# Patient Record
Sex: Male | Born: 1955 | Hispanic: No | Smoking: Former smoker
Health system: Southern US, Community
[De-identification: ages and names within clinical notes are randomized; demographics above are authoritative.]

## PROBLEM LIST (undated history)

## (undated) DIAGNOSIS — F419 Anxiety disorder, unspecified: Secondary | ICD-10-CM

## (undated) DIAGNOSIS — N184 Chronic kidney disease, stage 4 (severe): Secondary | ICD-10-CM

## (undated) DIAGNOSIS — B182 Chronic viral hepatitis C: Secondary | ICD-10-CM

## (undated) DIAGNOSIS — R351 Nocturia: Secondary | ICD-10-CM

## (undated) DIAGNOSIS — N401 Enlarged prostate with lower urinary tract symptoms: Secondary | ICD-10-CM

## (undated) HISTORY — DX: Chronic viral hepatitis C: B18.2

## (undated) HISTORY — DX: Nocturia: R35.1

## (undated) HISTORY — DX: Anxiety disorder, unspecified: F41.9

## (undated) HISTORY — DX: Chronic kidney disease, stage 4 (severe): N18.4

## (undated) HISTORY — DX: Benign prostatic hyperplasia with lower urinary tract symptoms: N40.1

---

## 2002-08-20 ENCOUNTER — Encounter: Payer: Self-pay | Admitting: Neurosurgery

## 2002-08-20 ENCOUNTER — Ambulatory Visit (HOSPITAL_COMMUNITY): Admission: RE | Admit: 2002-08-20 | Discharge: 2002-08-21 | Payer: Self-pay | Admitting: Neurosurgery

## 2002-12-21 ENCOUNTER — Inpatient Hospital Stay (HOSPITAL_COMMUNITY): Admission: RE | Admit: 2002-12-21 | Discharge: 2002-12-24 | Payer: Self-pay | Admitting: Neurosurgery

## 2012-10-03 DIAGNOSIS — M47812 Spondylosis without myelopathy or radiculopathy, cervical region: Secondary | ICD-10-CM | POA: Insufficient documentation

## 2012-11-06 DIAGNOSIS — I10 Essential (primary) hypertension: Secondary | ICD-10-CM | POA: Insufficient documentation

## 2012-11-06 DIAGNOSIS — N028 Recurrent and persistent hematuria with other morphologic changes: Secondary | ICD-10-CM | POA: Insufficient documentation

## 2015-02-09 DIAGNOSIS — E785 Hyperlipidemia, unspecified: Secondary | ICD-10-CM | POA: Insufficient documentation

## 2015-02-09 DIAGNOSIS — F3341 Major depressive disorder, recurrent, in partial remission: Secondary | ICD-10-CM | POA: Insufficient documentation

## 2015-02-09 DIAGNOSIS — A6000 Herpesviral infection of urogenital system, unspecified: Secondary | ICD-10-CM | POA: Insufficient documentation

## 2015-02-09 DIAGNOSIS — N401 Enlarged prostate with lower urinary tract symptoms: Secondary | ICD-10-CM

## 2015-02-09 DIAGNOSIS — N529 Male erectile dysfunction, unspecified: Secondary | ICD-10-CM | POA: Insufficient documentation

## 2015-02-09 DIAGNOSIS — F419 Anxiety disorder, unspecified: Secondary | ICD-10-CM

## 2015-02-09 DIAGNOSIS — F988 Other specified behavioral and emotional disorders with onset usually occurring in childhood and adolescence: Secondary | ICD-10-CM | POA: Insufficient documentation

## 2015-02-09 DIAGNOSIS — N184 Chronic kidney disease, stage 4 (severe): Secondary | ICD-10-CM

## 2015-02-09 DIAGNOSIS — K219 Gastro-esophageal reflux disease without esophagitis: Secondary | ICD-10-CM | POA: Insufficient documentation

## 2015-02-09 DIAGNOSIS — R351 Nocturia: Secondary | ICD-10-CM

## 2015-02-09 DIAGNOSIS — B182 Chronic viral hepatitis C: Secondary | ICD-10-CM

## 2015-02-09 HISTORY — DX: Chronic kidney disease, stage 4 (severe): N18.4

## 2015-02-09 HISTORY — DX: Anxiety disorder, unspecified: F41.9

## 2015-02-09 HISTORY — DX: Benign prostatic hyperplasia with lower urinary tract symptoms: N40.1

## 2015-02-09 HISTORY — DX: Chronic viral hepatitis C: B18.2

## 2015-07-01 DIAGNOSIS — M1A39X0 Chronic gout due to renal impairment, multiple sites, without tophus (tophi): Secondary | ICD-10-CM | POA: Insufficient documentation

## 2017-01-09 DIAGNOSIS — J301 Allergic rhinitis due to pollen: Secondary | ICD-10-CM | POA: Insufficient documentation

## 2018-02-19 NOTE — Progress Notes (Signed)
Patient Name: Tony Sandoval  Date of Birth: [birth year]  MRN: [id number]  PCP: Houston Siren., MD  Referring Provider: Dr. Blenda Mounts   Patient Active Problem List   Diagnosis Date Noted  . Seasonal allergic rhinitis due to pollen 01/09/2017  . 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 ADD (attention deficit disorder) 02/09/2015  . Anxiety disorder 02/09/2015  . Benign prostatic hyperplasia with nocturia 02/09/2015  . Chronic kidney disease, stage IV (severe) ([place]) 02/09/2015  . Chronic viral hepatitis C ([place]) 02/09/2015  . ED (erectile dysfunction) 02/09/2015  . Esophageal reflux 02/09/2015  . Genital herpes 02/09/2015  . Hyperlipidemia 02/09/2015  . Recurrent major depressive disorder, in partial remission ([place]) 02/09/2015  . HTN (hypertension), benign 11/06/2012  . IgA nephropathy 11/06/2012  . Cervical spondylosis 10/03/2012   CC: New patient here for hepatitis c treatment / evaluation.   HPI/ROS:  Tony Sandoval is a 63 y.o. male who presents for initial evaluation and management of chronic hepatitis C.  He tells me that he was previously told about his hepatitis C labs > 10 years ago with blood donation. He has never had further work up done for this and has never had any treatment for this. He has a history of significant back surgery in 1988 where he may have required a blood transfusion. He feels that he may have alternatively acquired through sexual encounter with male in the past. He denies any IVDU or intranasal drug use. Not on HD but CKD stage IV and trajectory looks like he may require in the future. He drinks "six days out of seven" and ranges from 3-8 beers daily. He has been doing this for 10 years now. He would like to quit/reduce use and feels this will be a good opportunity to allow that.   Patient does not have documented immunity to Hepatitis A. Patient does not have documented immunity to Hepatitis B.    Constitutional:  negative for fevers, chills, malaise and anorexia Eyes: negative for icterus Respiratory: negative for cough, sputum or dyspnea on exertion Cardiovascular: negative for chest pain, orthopnea, lower extremity edema Gastrointestinal: negative for melena, abdominal pain and jaundice Integument/breast: negative for rash and pruritus Hematologic/lymphatic: negative for bleeding and petechiae Neurological: negative for coordination problems, tremor and weakness All other systems reviewed and are negative      Past Medical History:  Diagnosis Date  . Anxiety disorder 02/09/2015  . Benign prostatic hyperplasia with nocturia 02/09/2015  . Chronic kidney disease, stage IV (severe) ([place]) 02/09/2015  . Chronic kidney disease, stage IV (severe) ([place]) 02/09/2015  . Chronic viral hepatitis C ([place]) 02/09/2015    Prior to Admission medications   Not on File    Allergies  Allergen Reactions  . Celecoxib Other (See Comments)    Affects emptying the bladder Affects emptying the bladder   . Nsaids Other (See Comments)  . Tramadol Other (See Comments)    Issues with emptying bladder Issues with emptying bladder     Social History   Tobacco Use  . Smoking status: Former Research scientist (life sciences)  . Smokeless tobacco: Never Used  Substance Use Topics  . Alcohol use: Yes    Alcohol/week: 40.0 standard drinks    Types: 40 Cans of beer per week  . Drug use: Never    Family History  Problem Relation Age of Onset  . Liver cancer Neg Hx   . Liver disease Neg Hx  Objective:   Vitals:   02/20/18 1005  BP: 121/85  Pulse: 79  Temp: (!) 97.3 F (36.3 C)   Constitutional: in no apparent distress, well developed and well nourished and oriented times 3 Eyes: anicteric Cardiovascular: Cor RRR and No murmurs Respiratory: clear Gastrointestinal: Bowel sounds are normal, liver is not enlarged, spleen is not enlarged Musculoskeletal: peripheral pulses normal, no pedal edema, no clubbing or cyanosis Skin: negative  for - jaundice, spider hemangioma, telangiectasia, palmar erythema, ecchymosis and atrophy, negatives Lymphatic: no cervical lymphadenopathy   Laboratory: Genotype: No results found for: HCVGENOTYPE HCV viral load: No results found for: HCVQUANT No results found for: WBC, HGB, HCT, MCV, PLT No results found for: CREATININE, BUN, NA, K, CL, CO2 No results found for: ALT, AST, GGT, ALKPHOS  No results found for: INR, BILITOT, ALBUMIN   Imaging:  Assessment & Plan:   Problem List Items Addressed This Visit      Unprioritized   Chronic kidney disease, stage IV (severe) ([place])    Plan for [place] with eGFR < 30 mL/min       Chronic viral hepatitis C ([place]) - Primary    New Patient with Chronic Hepatitis C genotype unknown, treatment naive.   I discussed with the patient the lab findings that confirm chronic hepatitis C as well as the natural history and progression of disease including about 30% of people who develop cirrhosis of the liver if left untreated and once cirrhosis is established there is a 2-7% risk per year of liver cancer and liver failure.  I discussed the importance of treatment and benefits in reducing the risk, even if significant liver fibrosis exists. I also discussed risk for re-infection following treatment should he not continue to modify risk factors.    Patient counseled extensively on limiting acetaminophen to no more than 2 grams daily, avoidance of alcohol.  Transmission discussed with patient including sexual transmission, sharing razors and toothbrush.   Will need referral to gastroenterology if concern for cirrhosis  Will prescribe appropriate medication based on genotype and coverage   Hepatitis A and B titers to be drawn today with appropriate vaccinations as needed   Pneumovax vaccine at upcoming visit if not previously given  Further work up to include liver staging through non-invasive serum analysis and elastography.   Will call Rae Halsted  back once all results are in and counsel on medication over the phone. He will return 4 weeks after starting to meet with pharmacy team and check RNA at that time.         Relevant Medications   acyclovir (ZOVIRAX) 200 MG capsule   mycophenolate (CELLCEPT) 500 MG tablet   valACYclovir (VALTREX) 1000 MG tablet   Other Relevant Orders   HIV Antibody (routine testing w rflx)   Hepatitis A Ab, Total   Hepatitis B surface antibody,qualitative   Hepatitis B surface antigen   Hepatitis B Core Antibody, total   Hepatitis C RNA quantitative   Hepatitis C genotype   Protime-INR   Liver Fibrosis, FibroTest-ActiTest   US ABDOMEN COMPLETE W/ELASTOGRAPHY     I spent 45 minutes with the patient including greater than 70% of time in face to face counsel of the patient re hepatitis c and the details described above and in coordination of their care.  Janene Madeira, MSN, NP-C [facility] for [email or web address] Disease [email or web address] Pager: [phone] Office: [phone]

## 2018-02-20 ENCOUNTER — Ambulatory Visit (INDEPENDENT_AMBULATORY_CARE_PROVIDER_SITE_OTHER): Payer: BLUE CROSS/BLUE SHIELD | Admitting: Infectious Diseases

## 2018-02-20 ENCOUNTER — Encounter: Payer: Self-pay | Admitting: Infectious Diseases

## 2018-02-20 VITALS — BP 121/85 | HR 79 | Temp 97.3°F | Wt 252.0 lb

## 2018-02-20 DIAGNOSIS — B182 Chronic viral hepatitis C: Secondary | ICD-10-CM

## 2018-02-20 DIAGNOSIS — N184 Chronic kidney disease, stage 4 (severe): Secondary | ICD-10-CM | POA: Diagnosis not present

## 2018-02-20 NOTE — Assessment & Plan Note (Signed)
Plan for [place] with eGFR < 30 mL/min

## 2018-02-20 NOTE — Assessment & Plan Note (Addendum)
New Patient with Chronic Hepatitis C genotype unknown, treatment naive.   I discussed with the patient the lab findings that confirm chronic hepatitis C as well as the natural history and progression of disease including about 30% of people who develop cirrhosis of the liver if left untreated and once cirrhosis is established there is a 2-7% risk per year of liver cancer and liver failure.  I discussed the importance of treatment and benefits in reducing the risk, even if significant liver fibrosis exists. I also discussed risk for re-infection following treatment should he not continue to modify risk factors.    Patient counseled extensively on limiting acetaminophen to no more than 2 grams daily, avoidance of alcohol.  Transmission discussed with patient including sexual transmission, sharing razors and toothbrush.   Will need referral to gastroenterology if concern for cirrhosis  Will prescribe appropriate medication based on genotype and coverage   Hepatitis A and B titers to be drawn today with appropriate vaccinations as needed   Pneumovax vaccine at upcoming visit if not previously given  Further work up to include liver staging through non-invasive serum analysis and elastography.   Will call Rae Halsted back once all results are in and counsel on medication over the phone. He will return 4 weeks after starting to meet with pharmacy team and check RNA at that time.

## 2018-02-20 NOTE — Progress Notes (Signed)
HPI: Tony Sandoval is a 63 y.o. male who presents to the [place] clinic to initiate care with Colletta Maryland for his chronic Hepatitis C infection.   Patient Active Problem List   Diagnosis Date Noted  . Seasonal allergic rhinitis due to pollen 01/09/2017  . 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 ADD (attention deficit disorder) 02/09/2015  . Anxiety disorder 02/09/2015  . Benign prostatic hyperplasia with nocturia 02/09/2015  . Chronic kidney disease, stage IV (severe) ([place]) 02/09/2015  . Chronic viral hepatitis C ([place]) 02/09/2015  . ED (erectile dysfunction) 02/09/2015  . Esophageal reflux 02/09/2015  . Genital herpes 02/09/2015  . Hyperlipidemia 02/09/2015  . Recurrent major depressive disorder, in partial remission ([place]) 02/09/2015  . HTN (hypertension), benign 11/06/2012  . IgA nephropathy 11/06/2012  . Cervical spondylosis 10/03/2012    Patient's Medications  New Prescriptions   No medications on file  Previous Medications   ACYCLOVIR (ZOVIRAX) 200 MG CAPSULE    Take by mouth.   ALLOPURINOL (ZYLOPRIM) 300 MG TABLET    Take by mouth.   ALPRAZOLAM (XANAX) 1 MG TABLET    Take by mouth.   AMITRIPTYLINE (ELAVIL) 50 MG TABLET       BENAZEPRIL (LOTENSIN) 20 MG TABLET       BUSPIRONE (BUSPAR) 15 MG TABLET    TAKE 1 TABLET BY MOUTH TWICE DAILY   ESCITALOPRAM (LEXAPRO) 20 MG TABLET    TAKE 1 TABLET BY MOUTH EVERY DAY   GABAPENTIN (NEURONTIN) 300 MG CAPSULE    Take by mouth.   HYDROCORTISONE (ANUSOL-HC) 2.5 % RECTAL CREAM    Place rectally.   METHYLPHENIDATE (RITALIN) 20 MG TABLET    Take by mouth.   MYCOPHENOLATE (CELLCEPT) 500 MG TABLET    Take by mouth.   NORTRIPTYLINE (PAMELOR) 50 MG CAPSULE    TAKE 1-3 CAPSULES AT BEDTIME   PANTOPRAZOLE (PROTONIX) 40 MG TABLET    Take by mouth.   POLYETHYLENE GLYCOL 3350 (PEG 3350) POWD    Take by mouth.   VALACYCLOVIR (VALTREX) 1000 MG TABLET      Modified Medications   No medications on file  Discontinued  Medications   No medications on file    Allergies: Allergies  Allergen Reactions  . Celecoxib Other (See Comments)    Affects emptying the bladder Affects emptying the bladder   . Nsaids Other (See Comments)  . Tramadol Other (See Comments)    Issues with emptying bladder Issues with emptying bladder     Past Medical History: No past medical history on file.  Social History: Social History   Socioeconomic History  . Marital status: Divorced    Spouse name: Not on file  . Number of children: Not on file  . Years of education: Not on file  . Highest education level: Not on file  Occupational History  . Not on file  Social Needs  . Financial resource strain: Not on file  . Food insecurity:    Worry: Not on file    Inability: Not on file  . Transportation needs:    Medical: Not on file    Non-medical: Not on file  Tobacco Use  . Smoking status: Former Research scientist (life sciences)  . Smokeless tobacco: Never Used  Substance and Sexual Activity  . Alcohol use: Not on file  . Drug use: Not on file  . Sexual activity: Not on file  Lifestyle  . Physical activity:    Days per week: Not on file  Minutes per session: Not on file  . Stress: Not on file  Relationships  . Social connections:    Talks on phone: Not on file    Gets together: Not on file    Attends religious service: Not on file    Active member of club or organization: Not on file    Attends meetings of clubs or organizations: Not on file    Relationship status: Not on file  Other Topics Concern  . Not on file  Social History Narrative  . Not on file    Labs: Hepatitis C No results found for: HCVGENOTYPE, HEPCAB, HCVRNAPCRQN, FIBROSTAGE Hepatitis B No results found for: HEPBSAB, HEPBSAG, HEPBCAB Hepatitis A No results found for: HAV HIV No results found for: HIV No results found for: CREATININE No results found for: AST, ALT, INR  Assessment: Tony Sandoval is here today to initiate care for his Hep C infection.  We  don't have any baseline labs for him today so Colletta Maryland will order those.  Met with the patient and discussed our process for medication and management.  Will call and coordinate starting once all of his labs have returned.    Plan: - Hep C labs   L. , PharmD, BCIDP, AAHIVP, [place] for Infectious Disease 02/20/2018, 11:04 AM

## 2018-02-20 NOTE — Patient Instructions (Signed)
Nice to meet you today!    We need to get a little more information about your hepatitis c infection before we start your treatment. I anticipate that we can get you started in a few weeks after we submit approval to your insurance to ensure payment.   Would like to go ahead and set up a liver ultrasound for you to make certain we fully understand your liver and assess for any existing damage.    ABOUT HEPATITIS C VIRUS:   Chronic Hepatitis C is the leading cause of cirrhosis, liver cancer, and end stage liver disease requiring transplantation when this infection goes untreated for many years  There are not many specific symptoms of early infection and often goes unrecognized until specific blood test is ordered  The hepatitis c virus is passed primarily through direct exposure of contaminated blood or body fluids -   Risk for sexual transmission is very low but is possible if there is high frequency of unprotected sexual activity with known hepatitis c partner or multiple partners of known status.  Advanced cirrhosis occurs in approximately 10-20% of those with chronic infection.  Approximately 15-25% clear spontaneously (usually in the first 6 months of becoming exposed to virus)  Newer medications provide over 90% cure rate when taken as prescribed  IN GENERAL ABOUT DIET  . Persons living with chronic hepatitis c infection should have a balanced diet and choose nutritious foods from each major food group to maintain a healthy weight and avoid nutritional deficiencies.  .  . Patients with cirrhosis should not have protein restriction; we recommend a protein intake of approximately 1.2-1.5 g/kg/day.  . For patients with cirrhosis and hepatic encephalopathy, the American Association for the Study of Liver Diseases (AASLD) recommended protein intake is 1.2-1.5 g/kg/day.[82] . If you experience ascites please limit sodium intake to < 2000 mg a day   UNTIL YOU HAVE BEEN TREATED AND  CURED:  . Use condoms with sexual encounters or abstinence . No sharing of razors, toothbrushes, nail clippers or anything that could potentially have blood on it.  . If you cut yourself and blood spills onto item/surface please clean with 1:10 bleach solution and allow to dry, EVEN if it is dried blood.  . Limit alcohol to as little as possible to less than 1 drink a day - this is very irritating to your liver. . Limit tylenol use to less than 2,000 mg daily (two extra strength tablets only twice a day) . If you have cirrhosis of the liver please take no more than 1,000 mg tylenol a day   GENERAL HELPFUL HINTS ON HCV THERAPY: 1. Stay well-hydrated. 2. Notify the ID Clinic of any changes in your other over-the-counter/herbal or prescription medications. 3. If you miss a dose of your medication, take the missed dose as soon as you remember. Return to your regular time/dose schedule the next day.  4.  Do not stop taking your medications without first talking with your healthcare provider. 5.  You will see our pharmacist-specialist within the first 2 weeks of starting your medication to monitor for any possible side effects. 6.  You will have blood work once during treatment 4 weeks after your first pill. Again soon after treatment is completed and one final lab 3 months after your last pill to ensure cure!   TIPS TO BE SUCCESSFUL WITH DAILY MEDICATION USE: 1. Set a reminder on your phone  2. Try filling out a pill box for the week - pick  a day and put one pill for every day during the week so you know right away if you missed a pill.  3. Have a trusted family member ask you about your medications.  4. Smartphone app    Medication we would like to use for you will be : MAVYRET  Mavyret Instructions:  1. Take Mavyret, three tablets (at the same time) daily with food. Please take ALL THREE PILLS AT ONCE. You should take it at approximately the same time every day. Treatment will be for 8  weeks. Do not miss a dose.    2. Do not run out of [place]! If you are down to one week of medication left and have not heard about your next shipment, please let us know as soon as possible. You will be given 28 days of treatment at a time and will receive one refill.   3. If you need to start a new medication, prescription from your doctor or over the counter medication, you need to contact us to make sure it does not interfere with [place]. There are several medications that can interfere with Mavyret and can make you sick or make the medication not work.  4. If you need to take a medication for acid reflux, you can take omeprazole 20mg  daily.     5. Tylenol (acetominophen) and Advil (ibuprofen) are safe to take with [place] if needed for headache, fever, pain.   6. IF YOU ARE ON BIRTH CONTROL PILLS, YOU RECEIVE A SHOT FOR BIRTH CONTROL, OR YOU HAVE AN IUD, please notify your provider to make sure it is safe with Mavyret.   7. DO NOT stop Mavyret unless instructed to by your provider. If you are hospitalized while taking this medication please bring it with you to the hospital to avoid interruption of therapy. Every pill is important!  8. The most common side effects associated with Mavyret include:  o Fatigue o Headache o Nausea o Diarrhea o Insomnia

## 2018-02-21 NOTE — Progress Notes (Signed)
Immunity through cleared hep b infection. Will need Hep A vaccines.

## 2018-02-25 ENCOUNTER — Telehealth: Payer: Self-pay | Admitting: Infectious Diseases

## 2018-02-25 NOTE — Telephone Encounter (Signed)
Attempted to call patient with results of Hep C lab work however the number on file is not working and listed work number has no answer.   His hep c RNA resulted as undetectable. He does not need any treatment as it appears he previously cleared his infection naturally vs had a false positive antibody test in the first place. He has evidence of previously cleared hep b infection as well.   No further intervention needed with ID team. Would consider checking regular liver ultrasound to evaluate considering his heavy alcohol history and intermittent transaminitis however will defer to his PCP.   Janene Madeira, MSN, NP-C [facility] for Infectious Disease [email or web address] Pager: [phone] Office: [phone]

## 2018-02-27 ENCOUNTER — Ambulatory Visit (HOSPITAL_COMMUNITY)
Admission: RE | Admit: 2018-02-27 | Discharge: 2018-02-27 | Disposition: A | Discharge status: Home / Self Care | Payer: BLUE CROSS/BLUE SHIELD | Source: Ambulatory Visit | Attending: Infectious Diseases | Admitting: Infectious Diseases

## 2018-02-27 DIAGNOSIS — B182 Chronic viral hepatitis C: Secondary | ICD-10-CM

## 2018-02-28 LAB — PROTIME-INR
INR: 1
Prothrombin Time: 10.7 s (ref 9.0–11.5)

## 2018-02-28 LAB — LIVER FIBROSIS, FIBROTEST-ACTITEST
ALT: 25 U/L (ref 9–46)
Alpha-2-Macroglobulin: 190 mg/dL (ref 106–279)
Apolipoprotein A1: 125 mg/dL (ref 94–176)
Bilirubin: 0.4 mg/dL (ref 0.2–1.2)
Fibrosis Score: 0.3
GGT: 29 U/L (ref 3–70)
Haptoglobin: 110 mg/dL (ref 43–212)
Necroinflammat ACT Score: 0.11
Reference ID: 2870976

## 2018-02-28 LAB — HEPATITIS B SURFACE ANTIGEN: Hepatitis B Surface Ag: NONREACTIVE

## 2018-02-28 LAB — HEPATITIS C RNA QUANTITATIVE
HCV Quantitative Log: <1.18 Log IU/mL
HCV RNA, PCR, QN: <15 IU/mL

## 2018-02-28 LAB — HEPATITIS B SURFACE ANTIBODY,QUALITATIVE: Hep B S Ab: REACTIVE — AB

## 2018-02-28 LAB — HIV ANTIBODY (ROUTINE TESTING W REFLEX): HIV 1&2 Ab, 4th Generation: NONREACTIVE

## 2018-02-28 LAB — HEPATITIS A ANTIBODY, TOTAL: Hepatitis A AB,Total: NONREACTIVE

## 2018-02-28 LAB — HEPATITIS C GENOTYPE: HCV Genotype: NOT DETECTED

## 2018-02-28 LAB — HEPATITIS B CORE ANTIBODY, TOTAL: Hep B Core Total Ab: REACTIVE — AB

## 2018-03-20 NOTE — Telephone Encounter (Signed)
Thanks, Colletta Maryland. Will send to triage for the faxing.

## 2018-03-20 NOTE — Telephone Encounter (Signed)
Patient called and left a message that he would like to discuss his results with Colletta Maryland. Will route to her to call patient when she has time. Number in epic is the correct number.

## 2018-03-20 NOTE — Telephone Encounter (Signed)
Called to notify patient of results of his lab work and ultrasound results.  Can we please fax the results of his labs, ultrasound and visit notes to his nephrologist Dr. Carolann Littler (Office [phone]).   Will also email him the lab results to [email or web address]

## 2018-03-21 NOTE — Telephone Encounter (Signed)
Thank you, Tony Sandoval! 

## 2018-03-21 NOTE — Telephone Encounter (Signed)
Called office to verify fax number and was given [phone]. Information faxed as requested by Janene Madeira.

## 2019-03-19 IMAGING — US US ABDOMEN COMPLETE W/ ELASTOGRAPHY
1 series · 11 of 11 positions shown · non-contrast
Comparison: None.

CLINICAL DATA: Chronic hepatitis c



[Series 1: us abdomen complete w/ elastography · 11 of 11 slices shown]
[im 1/11]
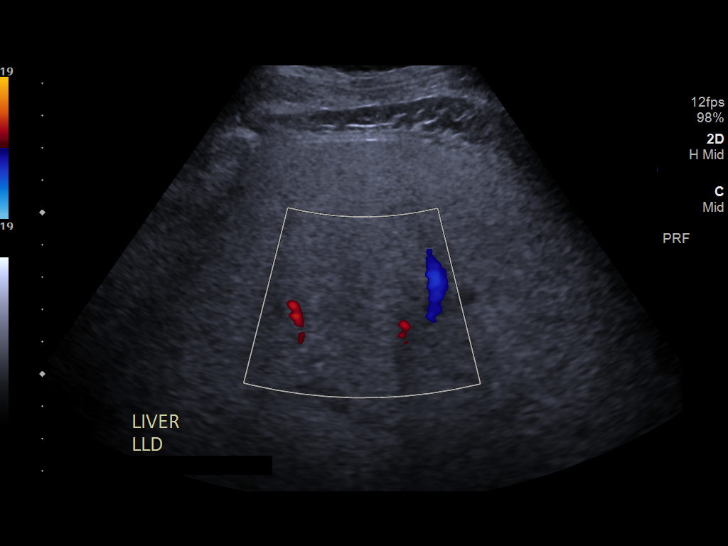
[im 2/11]
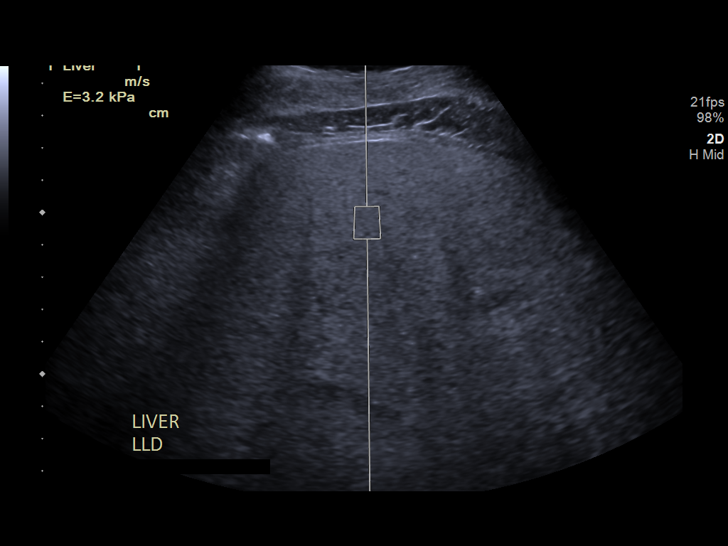
[im 3/11]
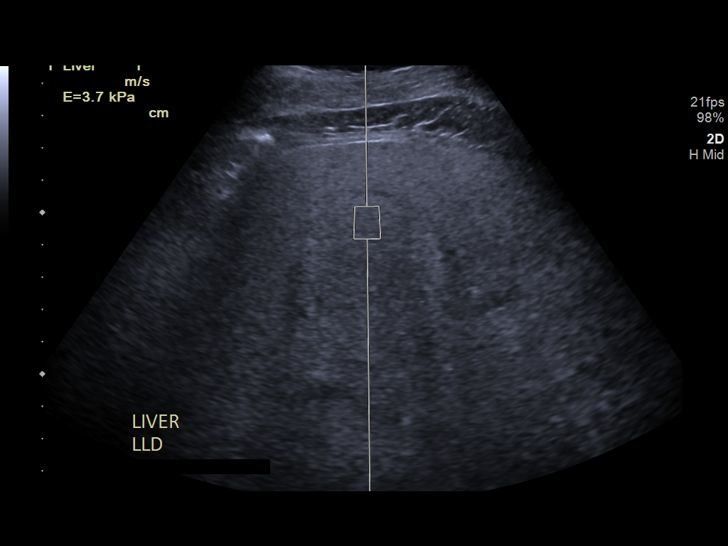
[im 4/11]
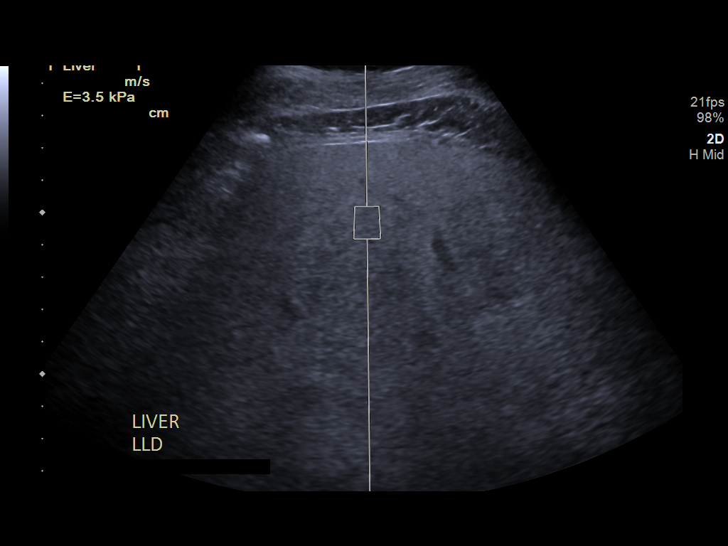
[im 5/11]
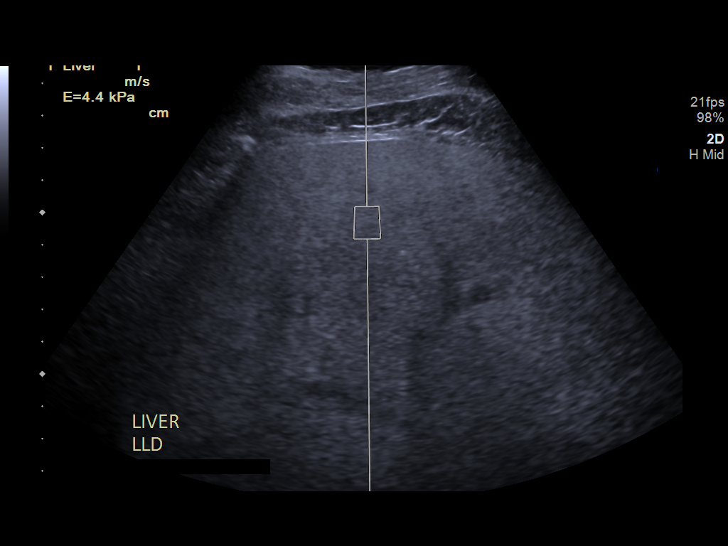
[im 6/11]
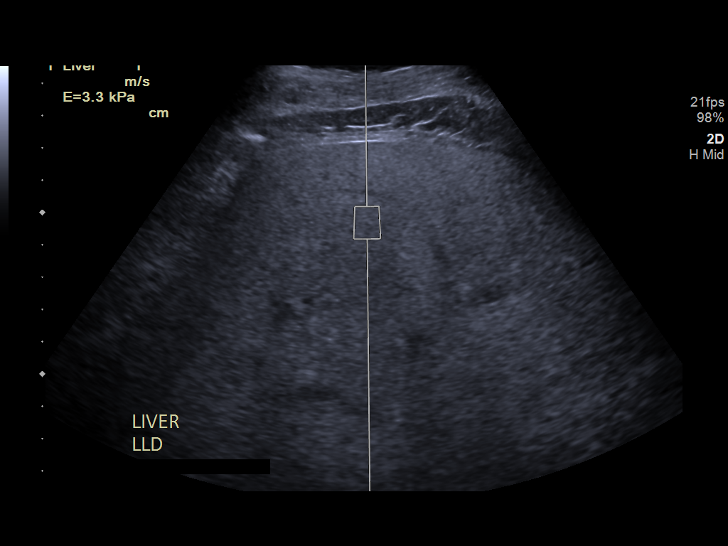
[im 7/11]
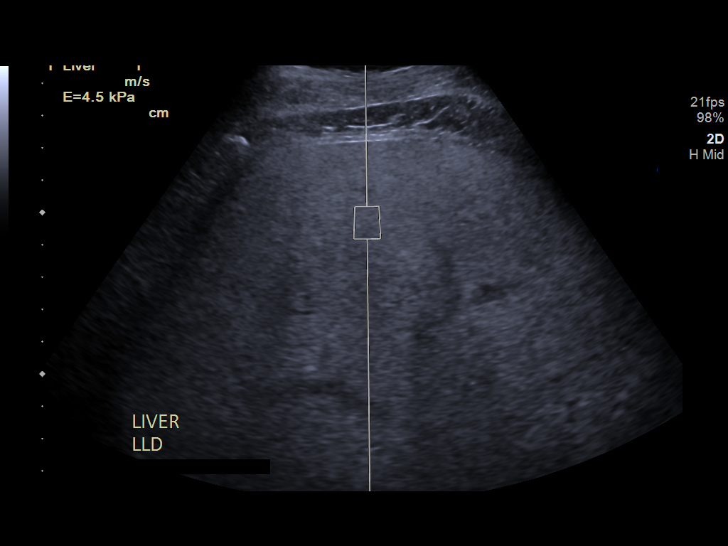
[im 8/11]
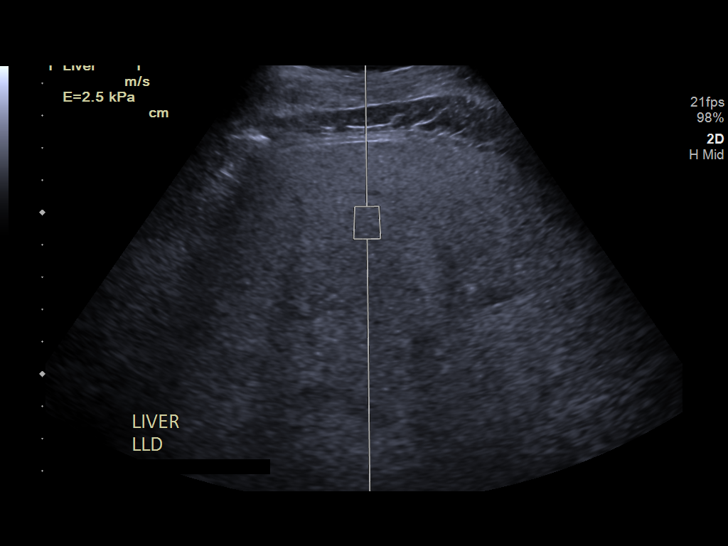
[im 9/11]
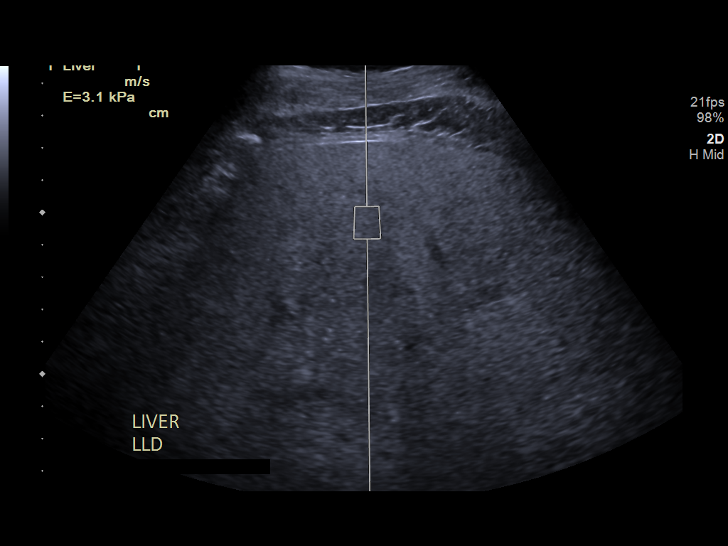
[im 10/11]
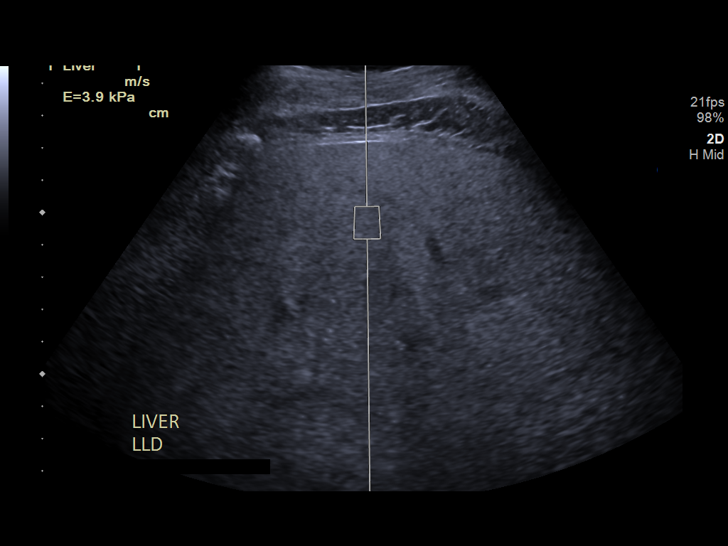
[im 11/11]
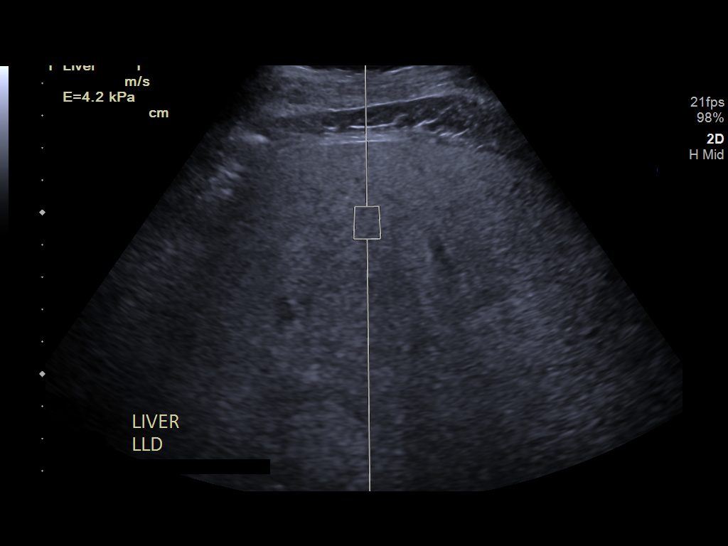

[11 of 11 positions shown; findings below may reference images not displayed]

FINDINGS: ULTRASOUND ABDOMEN

Gallbladder: No gallstones or wall thickening visualized. No
sonographic Murphy sign noted by sonographer.

Common bile duct: Diameter: 8 mm

Liver: A septated cystic lesion measuring 0.9 by 0.7 by 0.9 cm is
present in the liver, documented as the right hepatic lobe by the
sonographer, close to the gallbladder. Diffuse hepatic
hyperechogenicity. Portal vein is patent on color Doppler imaging
with normal direction of blood flow towards the liver.

IVC: No abnormality visualized.

Pancreas: Not seen due to overlying bowel gas.

Spleen: 13.5 cm in length, 619 cubic cm in volume

Right Kidney: Length: 10.0 cm. Borderline right hydronephrosis.
cm right kidney lower pole cyst. 1.0 cm right mid kidney cyst. No
mass or hydronephrosis visualized.

Left Kidney: Length: 12.0 cm. Echogenicity within normal limits. No
hydronephrosis. 0.8 cm cyst observed.

Abdominal aorta: Limited assessment due to overlying bowel gas. No
definite aneurysm.

Other findings: None.

ULTRASOUND HEPATIC ELASTOGRAPHY

Device: Siemens Helix VTQ

Patient position: Left lateral decubitus

Transducer [id number]

Number of measurements: 10

Hepatic segment:  8

Median velocity:   1.10 m/sec

IQR:

IQR/Median velocity ratio:

Corresponding Metavir fibrosis score:  F0/F1

Risk of fibrosis: Minimal

Limitations of exam: None

Please note that abnormal shear wave velocities may also be
identified in clinical settings other than with hepatic fibrosis,
such as: acute hepatitis, elevated right heart and central venous
pressures including use of beta blockers, Ebadat disease
(Guillermo Raul), infiltrative processes such as
mastocytosis/amyloidosis/infiltrative tumor, extrahepatic
cholestasis, in the post-prandial state, and liver transplantation.
Correlation with patient history, laboratory data, and clinical
condition recommended.
IMPRESSION: ULTRASOUND ABDOMEN:

1. Septated cyst, 0.9 cm, in the right hepatic lobe. No definite
internal nodularity.
2. Coarse echogenic liver with poor sonic penetration compatible
with diffuse hepatic steatosis.
3. Borderline right hydronephrosis, cause uncertain. Small bilateral
renal cysts.
4. Mildly dilated common bile duct at 0.8 cm , no directly
visualized choledocholithiasis.

ULTRASOUND HEPATIC ELASTOGRAPHY:

Median hepatic shear wave velocity is calculated at 1.10 m/sec.

Corresponding Metavir fibrosis score is F0/F1.

Risk of fibrosis is minimal.

Follow-up: None required

## 2021-09-05 ENCOUNTER — Other Ambulatory Visit (HOSPITAL_BASED_OUTPATIENT_CLINIC_OR_DEPARTMENT_OTHER): Payer: Self-pay | Admitting: Neurosurgery

## 2021-09-05 DIAGNOSIS — M5416 Radiculopathy, lumbar region: Secondary | ICD-10-CM

## 2021-09-16 ENCOUNTER — Ambulatory Visit (HOSPITAL_BASED_OUTPATIENT_CLINIC_OR_DEPARTMENT_OTHER)
Admission: RE | Admit: 2021-09-16 | Discharge: 2021-09-16 | Disposition: A | Discharge status: Home / Self Care | Payer: Medicare Other | Source: Ambulatory Visit | Attending: Neurosurgery | Admitting: Neurosurgery

## 2021-09-16 DIAGNOSIS — M5416 Radiculopathy, lumbar region: Secondary | ICD-10-CM | POA: Insufficient documentation
